# Patient Record
Sex: Female | Born: 1970 | Race: White | Hispanic: No | Marital: Married | Smoking: Never smoker
Health system: Southern US, Community
[De-identification: ages and names within clinical notes are randomized; demographics above are authoritative.]

## PROBLEM LIST (undated history)

## (undated) DIAGNOSIS — E109 Type 1 diabetes mellitus without complications: Secondary | ICD-10-CM

## (undated) DIAGNOSIS — E039 Hypothyroidism, unspecified: Secondary | ICD-10-CM

## (undated) HISTORY — DX: Type 1 diabetes mellitus without complications: E10.9

## (undated) HISTORY — DX: Hypothyroidism, unspecified: E03.9

---

## 2001-05-27 ENCOUNTER — Inpatient Hospital Stay (HOSPITAL_COMMUNITY): Admission: AD | Admit: 2001-05-27 | Discharge: 2001-05-29 | Payer: Self-pay | Admitting: *Deleted

## 2001-07-06 ENCOUNTER — Other Ambulatory Visit: Admission: RE | Admit: 2001-07-06 | Discharge: 2001-07-06 | Payer: Self-pay | Admitting: Gynecology

## 2002-01-03 ENCOUNTER — Encounter: Payer: Self-pay | Admitting: Gynecology

## 2002-01-03 ENCOUNTER — Encounter: Admission: RE | Admit: 2002-01-03 | Discharge: 2002-01-03 | Payer: Self-pay | Admitting: Gynecology

## 2003-09-19 ENCOUNTER — Other Ambulatory Visit: Admission: RE | Admit: 2003-09-19 | Discharge: 2003-09-19 | Payer: Self-pay | Admitting: Gynecology

## 2007-03-09 ENCOUNTER — Other Ambulatory Visit: Admission: RE | Admit: 2007-03-09 | Discharge: 2007-03-09 | Payer: Self-pay | Admitting: Gynecology

## 2009-10-24 ENCOUNTER — Ambulatory Visit: Payer: Self-pay | Admitting: Women's Health

## 2009-10-24 ENCOUNTER — Other Ambulatory Visit: Admission: RE | Admit: 2009-10-24 | Discharge: 2009-10-24 | Payer: Self-pay | Admitting: Gynecology

## 2010-07-11 NOTE — Discharge Summary (Signed)
[facility]  Patient:    DEVA, RON Visit Number: [id number] MRN: [id number]          Service Type: OBS Location: [id number] Attending Physician:  Douglass Rivers Dictated by:   Antony Contras, [facility] Admit Date:  05/27/2001 Discharge Date: 05/29/2001                             Discharge Summary  DISCHARGE DIAGNOSES:          1. Intrauterine pregnancy at 38-4/7 weeks.                                  Spontaneous onset of labor.                               2. History of gestational diabetes.                               3. Hypothyroidism.  PROCEDURES:                   Normal spontaneous vaginal delivery of a viable infant over intact perineum with repair of second-degree laceration.  HISTORY OF PRESENT ILLNESS:   The patient is a 40 year old gravida 2, para 1-0-0-1 with an LMP of August 29, 2000.  [facility] June 06, 2001.  Prenatal risk factors include a history of gestational diabetes, hypothyroidism well controlled, fetal pyelectasis which did resolve during the current pregnancy.  LABORATORY DATA:              Blood type A+, antibody screen negative.  RPR, HBsAg, HIV nonreactive.  GBS was negative.  HOSPITAL COURSE:              The patient was admitted on May 27, 2001 with spontaneous onset of labor.  Cervix was 3 cm, 70%, and -2 station.  She did progress to complete dilatation and delivered an Apgar 8/9 female infant weighing 8 pounds and 1 ounce over an intact perineum with repair of second-degree laceration.  POSTPARTUM COURSE:            She remained afebrile.  No difficulty voiding. She was able to be discharged in satisfactory condition on her second postpartum day.  LABORATORY DATA:              CBC:  Hematocrit 31.3, hemoglobin 10.9, WBC 10, platelets 137.  DISPOSITION:                  Follow up in six weeks.  Continue with prenatal vitamins and iron.  Motrin and Tylox for pain. Dictated by:   Antony Contras,  [facility] Attending Physician:  Douglass Rivers DD:  06/13/01 TD:  06/13/01 Job: [id number]

## 2011-04-20 ENCOUNTER — Other Ambulatory Visit: Payer: Self-pay | Admitting: Endocrinology

## 2011-04-20 DIAGNOSIS — E041 Nontoxic single thyroid nodule: Secondary | ICD-10-CM

## 2011-04-29 ENCOUNTER — Other Ambulatory Visit: Payer: Self-pay

## 2011-05-04 ENCOUNTER — Ambulatory Visit
Admission: RE | Admit: 2011-05-04 | Discharge: 2011-05-04 | Disposition: A | Discharge status: Home / Self Care | Payer: 59 | Source: Ambulatory Visit | Attending: Endocrinology | Admitting: Endocrinology

## 2011-05-04 DIAGNOSIS — E041 Nontoxic single thyroid nodule: Secondary | ICD-10-CM

## 2011-09-04 ENCOUNTER — Encounter: Payer: Self-pay | Admitting: Gynecology

## 2011-09-04 ENCOUNTER — Encounter: Payer: Self-pay | Admitting: Women's Health

## 2011-09-04 DIAGNOSIS — E039 Hypothyroidism, unspecified: Secondary | ICD-10-CM | POA: Insufficient documentation

## 2011-09-04 DIAGNOSIS — G510 Bell's palsy: Secondary | ICD-10-CM | POA: Insufficient documentation

## 2011-09-09 ENCOUNTER — Encounter: Payer: 59 | Admitting: Women's Health

## 2011-09-21 ENCOUNTER — Encounter: Payer: 59 | Admitting: Women's Health

## 2011-09-24 ENCOUNTER — Ambulatory Visit (INDEPENDENT_AMBULATORY_CARE_PROVIDER_SITE_OTHER): Payer: 59 | Admitting: Women's Health

## 2011-09-24 ENCOUNTER — Encounter: Payer: Self-pay | Admitting: Women's Health

## 2011-09-24 ENCOUNTER — Other Ambulatory Visit (HOSPITAL_COMMUNITY)
Admission: RE | Admit: 2011-09-24 | Discharge: 2011-09-24 | Disposition: A | Discharge status: Home / Self Care | Payer: 59 | Source: Ambulatory Visit | Attending: Obstetrics and Gynecology | Admitting: Obstetrics and Gynecology

## 2011-09-24 VITALS — BP 112/70 | Ht 63.5 in | Wt 118.0 lb

## 2011-09-24 DIAGNOSIS — Z01419 Encounter for gynecological examination (general) (routine) without abnormal findings: Secondary | ICD-10-CM

## 2011-09-24 DIAGNOSIS — Z8632 Personal history of gestational diabetes: Secondary | ICD-10-CM | POA: Insufficient documentation

## 2011-09-24 NOTE — Patient Instructions (Addendum)

## 2011-09-24 NOTE — Progress Notes (Signed)
Eileen Gomez 1970/12/23 [id number]    History:    The patient presents for annual exam.  Monthly 4-5 day cycles/vasectomy. History of GDM and hypothyroidism.  History of normal Paps, last one 2011. Has not had a baseline mammogram.   Past medical history, past surgical history, family history and social history were all reviewed and documented in the EPIC chart. Works several hours a day at [employer]. Daughter Kara Mead 15,  Elizabeth 10, both doing well. History of bells palsy in 2003 resolved. Contemplating a breast lift and tummy tuck due to loose skin.   ROS:  A  ROS was performed and pertinent positives and negatives are included in the history.  Exam:  Filed Vitals:   09/24/11 1439  BP: 112/70    General appearance:  Normal Head/Neck:  Normal, without cervical or supraclavicular adenopathy. Thyroid:  Symmetrical, normal in size, without palpable masses or nodularity. Respiratory  Effort:  Normal  Auscultation:  Clear without wheezing or rhonchi Cardiovascular  Auscultation:  Regular rate, without rubs, murmurs or gallops  Edema/varicosities:  Not grossly evident Abdominal  Soft,nontender, without masses, guarding or rebound.  Liver/spleen:  No organomegaly noted  Hernia:  None appreciated  Skin  Inspection:  Grossly normal  Palpation:  Grossly normal Neurologic/psychiatric  Orientation:  Normal with appropriate conversation.  Mood/affect:  Normal  Genitourinary    Breasts: Examined lying and sitting.     Right: Without masses, retractions, discharge or axillary adenopathy.     Left: Without masses, retractions, discharge or axillary adenopathy.   Inguinal/mons:  Normal without inguinal adenopathy  External genitalia:  Normal  BUS/Urethra/Skene's glands:  Normal  Bladder:  Normal  Vagina:  Normal  Cervix:  Normal  Uterus:   normal in size, shape and contour.  Midline and mobile  Adnexa/parametria:     Rt: Without masses or tenderness.   Lt: Without masses  or tenderness.  Anus and perineum: Normal  Digital rectal exam: Normal sphincter tone without palpated masses or tenderness  Assessment/Plan:  41 y.o. M. WF G2 P2 for annual exam without complaint.  Normal GYN exam Hypothyroid-Synthroid 50 mcg labs and meds at primary care.  Plan: SBE's, schedule annual mammogram, calcium rich diet, vitamin D 1000 daily, continue exercise/daily running. Pap only, reviewed new screening guidelines.    Harrington Challenger [facility], 4:01 PM 09/24/2011

## 2012-09-25 ENCOUNTER — Other Ambulatory Visit: Payer: Self-pay | Admitting: Endocrinology

## 2012-09-25 DIAGNOSIS — E039 Hypothyroidism, unspecified: Secondary | ICD-10-CM

## 2012-09-26 ENCOUNTER — Other Ambulatory Visit (INDEPENDENT_AMBULATORY_CARE_PROVIDER_SITE_OTHER): Payer: 59

## 2012-09-26 DIAGNOSIS — E039 Hypothyroidism, unspecified: Secondary | ICD-10-CM

## 2012-09-26 LAB — TSH: TSH: 1.82 u[IU]/mL (ref 0.35–5.50)

## 2012-09-28 ENCOUNTER — Ambulatory Visit: Payer: 59 | Admitting: Endocrinology

## 2012-09-30 ENCOUNTER — Encounter: Payer: Self-pay | Admitting: Endocrinology

## 2012-09-30 ENCOUNTER — Ambulatory Visit (INDEPENDENT_AMBULATORY_CARE_PROVIDER_SITE_OTHER): Payer: 59 | Admitting: Endocrinology

## 2012-09-30 VITALS — BP 110/74 | HR 63 | Temp 98.6°F | Resp 10 | Ht 64.0 in | Wt 116.5 lb

## 2012-09-30 DIAGNOSIS — Z131 Encounter for screening for diabetes mellitus: Secondary | ICD-10-CM

## 2012-09-30 DIAGNOSIS — E039 Hypothyroidism, unspecified: Secondary | ICD-10-CM

## 2012-09-30 NOTE — Progress Notes (Signed)
Patient ID: Eileen Gomez, female   DOB: 10-21-1970, 42 y.o.   MRN: [id number]  Reason for Appointment:  Hypothyroidism, followup visit    History of Present Illness:   The hypothyroidism was first diagnosed at age 89  Complaints are reported by the patient now are none, no unusual fatigue, dry skin or hair loss              The treatments that the patient has taken include Synthroid and has been on a stable dose of 50 mcg for several years.           She also has had a history of goiter likely to be from Hashimoto's thyroiditis        Compliance with the medical regimen has been as prescribed with taking the tablet in the morning before breakfast.  Appointment on 09/26/2012  Component Date Value Range Status  . TSH 09/26/2012 1.82  0.35 - 5.50 uIU/mL Final  . Free T4 09/26/2012 1.01  0.60 - 1.60 ng/dL Final      Medication List       This list is accurate as of: 09/30/12  3:25 PM.  Always use your most recent med list.               levothyroxine 50 MCG tablet  Commonly known as:  SYNTHROID, LEVOTHROID  Take 50 mcg by mouth daily.        Past Medical History  Diagnosis Date  . Diabetes mellitus     gestational diabetes--diet  . Hypothyroid   . Bell's palsy 2003    resolved    No past surgical history on file.  Family History  Problem Relation Age of Onset  . Hypertension Mother   . Heart disease Mother   . Hypertension Maternal Grandmother   . Heart disease Maternal Grandmother   . Breast cancer Paternal Grandmother     Social History:  reports that she has never smoked. She has never used smokeless tobacco. She reports that she does not drink alcohol or use illicit drugs.  Allergies: No Known Allergies   Examination:   BP 110/74  Pulse 63  Temp(Src) 98.6 F (37 C)  Resp 10  Ht 5\' 4"  (1.626 m)  Wt 116 lb 8 oz (52.844 kg)  BMI 19.99 kg/m2  SpO2 99%  LMP 08/30/2012   GENERAL APPEARANCE: Alert And looks well.    FACE: No puffiness of face or  fingers      NECK: right lobe of the thyroid 2-1/2 times normal, smoothed          NEUROLOGIC EXAM: DTRs 2+ bilaterally at biceps.    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 Avoid taking any calcium or iron supplements with the thyroid supplement. To followup annually  [facility] 09/30/2012, 3:25 PM

## 2012-09-30 NOTE — Patient Instructions (Addendum)
No change 

## 2013-05-19 ENCOUNTER — Other Ambulatory Visit: Payer: Self-pay | Admitting: *Deleted

## 2013-05-19 ENCOUNTER — Telehealth: Payer: Self-pay | Admitting: Endocrinology

## 2013-05-19 MED ORDER — LEVOTHYROXINE SODIUM 50 MCG PO TABS: 50.0000 ug | ORAL_TABLET | Freq: Every day | ORAL | Status: DC

## 2013-05-19 NOTE — Telephone Encounter (Signed)
Pt states her insurance company is giving her an issue with her RX  Synthroid 50mcg  Pt wants to know if we can give her a week supply of sample  [facility] [phone]  Thank You :)

## 2013-06-01 ENCOUNTER — Telehealth: Payer: Self-pay | Admitting: Endocrinology

## 2013-06-01 ENCOUNTER — Other Ambulatory Visit: Payer: Self-pay | Admitting: *Deleted

## 2013-06-01 MED ORDER — LEVOTHYROXINE SODIUM 50 MCG PO TABS: 50.0000 ug | ORAL_TABLET | Freq: Every day | ORAL | Status: DC

## 2013-06-01 NOTE — Telephone Encounter (Signed)
I spoke with the patient, rx has been sent to her local pharmacy and to her mail order pharmacy

## 2013-06-01 NOTE — Telephone Encounter (Signed)
Pt states her insurance is still giving her issues with her Rx Synthroid 50 mcg  Pt is confused and needs to speak with nurse   Call back: [phone]  Thank You :)

## 2013-11-29 ENCOUNTER — Encounter: Payer: Self-pay | Admitting: Women's Health

## 2013-12-25 ENCOUNTER — Encounter: Payer: Self-pay | Admitting: Endocrinology

## 2014-09-20 ENCOUNTER — Encounter: Payer: Self-pay | Admitting: Women's Health

## 2014-09-27 ENCOUNTER — Other Ambulatory Visit (HOSPITAL_COMMUNITY)
Admission: RE | Admit: 2014-09-27 | Discharge: 2014-09-27 | Disposition: A | Discharge status: Home / Self Care | Payer: 59 | Source: Ambulatory Visit | Attending: Gynecology | Admitting: Gynecology

## 2014-09-27 ENCOUNTER — Encounter: Payer: Self-pay | Admitting: Women's Health

## 2014-09-27 ENCOUNTER — Ambulatory Visit (INDEPENDENT_AMBULATORY_CARE_PROVIDER_SITE_OTHER): Payer: 59 | Admitting: Women's Health

## 2014-09-27 VITALS — BP 110/70 | Ht 64.0 in | Wt 119.0 lb

## 2014-09-27 DIAGNOSIS — Z01419 Encounter for gynecological examination (general) (routine) without abnormal findings: Secondary | ICD-10-CM | POA: Diagnosis not present

## 2014-09-27 NOTE — Addendum Note (Signed)
Addended by: Berna Spare A on: 09/27/2014 03:14 PM   Modules accepted: Orders, SmartSet

## 2014-09-27 NOTE — Progress Notes (Signed)
Eileen Gomez 1970/06/16 [id number]    History:    Presents for annual exam.  Monthly cycle 4-5 day cycles/vasectomy. History of normal Paps. Has not had a screening mammogram. History of GDM, bells palsy 2003. Endocrinologist manages labs hypothyroid.  Past medical history, past surgical history, family history and social history were all reviewed and documented in the EPIC chart. Works in [employer] at [employer]. [employer]. Kara Mead 17 attending [employer], [place] 12 both doing well.  ROS:  A ROS was performed and pertinent positives and negatives are included.  Exam:  Filed Vitals:   09/27/14 1153  BP: 110/70    General appearance:  Normal Thyroid:  Symmetrical, normal in size, without palpable masses or nodularity. Respiratory  Auscultation:  Clear without wheezing or rhonchi Cardiovascular  Auscultation:  Regular rate, without rubs, murmurs or gallops  Edema/varicosities:  Not grossly evident Abdominal  Soft,nontender, without masses, guarding or rebound.  Liver/spleen:  No organomegaly noted  Hernia:  None appreciated  Skin  Inspection:  Grossly normal   Breasts: Examined lying and sitting.     Right: Without masses, retractions, discharge or axillary adenopathy.     Left: Without masses, retractions, discharge or axillary adenopathy. Gentitourinary   Inguinal/mons:  Normal without inguinal adenopathy  External genitalia:  Normal  BUS/Urethra/Skene's glands:  Normal  Vagina:  Normal  Cervix:  Normal  Uterus:  normal in size, shape and contour.  Midline and mobile  Adnexa/parametria:     Rt: Without masses or tenderness.   Lt: Without masses or tenderness.  Anus and perineum: Normal  Digital rectal exam: Normal sphincter tone without palpated masses or tenderness  Assessment/Plan:  44 y.o. M WF G2 P2  for annual exam with complaint of hair loss/thinning.  Monthly 4-5 day cycles/vasectomy Hypothyroid-endocrinologist manages labs and meds  Plan: Encouraged  vitamins specifically for hair and skin may help. SBE's, reviewed importance of screening annual mammogram, breast center information given encouraged to schedule ASAP. Exercise, calcium rich diet, vitamin D 1000 daily encouraged. UA, Pap with HR HPV typing, new screening guidelines reviewed.  Mikiah Demond J WHNP, 1:25 PM 09/27/2014

## 2014-09-27 NOTE — Patient Instructions (Signed)

## 2014-10-01 LAB — CYTOLOGY - PAP

## 2016-04-02 ENCOUNTER — Encounter: Payer: Self-pay | Admitting: Women's Health

## 2016-04-02 ENCOUNTER — Ambulatory Visit (INDEPENDENT_AMBULATORY_CARE_PROVIDER_SITE_OTHER): Payer: BLUE CROSS/BLUE SHIELD | Admitting: Women's Health

## 2016-04-02 VITALS — BP 112/70

## 2016-04-02 DIAGNOSIS — N898 Other specified noninflammatory disorders of vagina: Secondary | ICD-10-CM | POA: Diagnosis not present

## 2016-04-02 DIAGNOSIS — B373 Candidiasis of vulva and vagina: Secondary | ICD-10-CM

## 2016-04-02 DIAGNOSIS — L739 Follicular disorder, unspecified: Secondary | ICD-10-CM

## 2016-04-02 DIAGNOSIS — B3731 Acute candidiasis of vulva and vagina: Secondary | ICD-10-CM

## 2016-04-02 LAB — WET PREP FOR TRICH, YEAST, CLUE
CLUE CELLS WET PREP: NONE SEEN
TRICH WET PREP: NONE SEEN
Yeast Wet Prep HPF POC: NONE SEEN

## 2016-04-02 MED ORDER — CEPHALEXIN 500 MG PO CAPS: 500.0000 mg | ORAL_CAPSULE | Freq: Four times a day (QID) | ORAL | 0 refills | Status: DC

## 2016-04-02 MED ORDER — FLUCONAZOLE 150 MG PO TABS: 150.0000 mg | ORAL_TABLET | Freq: Once | ORAL | 1 refills | Status: AC

## 2016-04-02 NOTE — Patient Instructions (Signed)

## 2016-04-02 NOTE — Progress Notes (Signed)
Presents with a complain vaginal itching and leision on left upper side of her clitoris.  The itch started a week ago while the lesion erupted 4 days ago. She noted some blood tinged discharge from the lesion. Denies any history of genital herpes, in a monogamous relationship with her husband. Denies fever, chills, abdominal pain, nausea and vomiting. Denies spotting or abnormal vaginal bleeding.History of normal PAP.Monthly cycle 4-5 day cycles/vasectomy. Patient is a runner and has increased her frequency recently.  Current Outpatient Prescriptions on File Prior to Visit  Medication Sig Dispense Refill  . levothyroxine (SYNTHROID, LEVOTHROID) 50 MCG tablet Take 1 tablet (50 mcg total) by mouth daily. 30 tablet 1   No current facility-administered medications on file prior to visit.    No Known Allergies   ROS:  A ROS was performed and pertinent positives and negatives are included.  Exam: Pea size Superficial cyst located on the upper left side of Labia majora, tip erythmatous and firm, tender to touch, non-indurated, no discharge noted. Other external genitalia structures normal and intact.  Wet prep: positive for moderate WBC, many bacteria, >20 hpf epithelia cells.  Assessment Perineal Folliculitis Yeast vaginitis  PLAN  1. Perineal folliculitis  - cephALEXin (KEFLEX) 500 MG capsule; Take 1 capsule (500 mg total) by mouth 4 (four) times daily.  Dispense: 20 capsule; Refill: 0  2. Yeast vaginitis - WET PREP FOR TRICH, YEAST, CLUE - fluconazole (DIFLUCAN) 150 MG tablet; Take 1 tablet (150 mg total) by mouth once.  Dispense: 1 tablet; Refill: 1  Complete antibiotic course, avoid tong under wear, do not wear under wear to sleep at night. Once area opens apply topical antibiotic cream, keep open, dry and loose clothing.  Call if symptom worsens or does not get better.

## 2016-04-16 ENCOUNTER — Ambulatory Visit (INDEPENDENT_AMBULATORY_CARE_PROVIDER_SITE_OTHER): Payer: BLUE CROSS/BLUE SHIELD | Admitting: Women's Health

## 2016-04-16 ENCOUNTER — Encounter: Payer: Self-pay | Admitting: Women's Health

## 2016-04-16 VITALS — BP 112/78 | Ht 64.0 in | Wt 114.0 lb

## 2016-04-16 DIAGNOSIS — Z01419 Encounter for gynecological examination (general) (routine) without abnormal findings: Secondary | ICD-10-CM | POA: Diagnosis not present

## 2016-04-16 NOTE — Progress Notes (Signed)
Barkley BrunsKristin E Rosetti 10/21/1970 [id number]    History:    Presents for annual exam. Regular monthly 4-5 day cycles/vasectomy. Normal Pap history. Has not had a screening mammogram. History of GDM diet. Hypothyroid endocrinologist manages reports normal blood sugars at endocrinologist.  Past medical history, past surgical history, family history and social history were all reviewed and documented in the EPIC chart. Works [employer]. [employer] also in the  reserves. Lanora Manislizabeth 14 doing well both had received gardasil. Mother hypertension and heart disease.  ROS:  A ROS was performed and pertinent positives and negatives are included.  Exam:  Vitals:   04/16/16 1431  BP: 112/78  Weight: 114 lb (51.7 kg)  Height: 5\' 4"  (1.626 m)   Body mass index is 19.57 kg/m.   General appearance:  Normal Thyroid:  Symmetrical, normal in size, without palpable masses or nodularity. Respiratory  Auscultation:  Clear without wheezing or rhonchi Cardiovascular  Auscultation:  Regular rate, without rubs, murmurs or gallops  Edema/varicosities:  Not grossly evident Abdominal  Soft,nontender, without masses, guarding or rebound.  Liver/spleen:  No organomegaly noted  Hernia:  None appreciated  Skin  Inspection:  Grossly normal   Breasts: Examined lying and sitting.     Right: Without masses, retractions, discharge or axillary adenopathy.     Left: Without masses, retractions, discharge or axillary adenopathy. Gentitourinary   Inguinal/mons:  Normal without inguinal adenopathy  External genitalia:  Normal  BUS/Urethra/Skene's glands:  Normal  Vagina:  Normal  Cervix:  Normal  Uterus:   normal in size, shape and contour.  Midline and mobile  Adnexa/parametria:     Rt: Without masses or tenderness.   Lt: Without masses or tenderness.  Anus and perineum: Normal  Digital rectal exam: Normal sphincter tone without palpated masses or  tenderness  Assessment/Plan:  46 y.o. MWF G2 P2  for annual exam with no complaints.  Regular monthly 4-5 day cycles/vasectomy Hypothyroid/endocrinologist manages labs and meds  In: SBE's, reviewed importance of annual screening mammogram breast center information given and reviewed instructed to schedule. Continue healthy lifestyle of regular exercise, calcium rich diet, vitamin D 1000 daily encouraged. Pap, requested minimum due to poor insurance, normal Pap history  reviewed HR HPV typing, declines.     Harrington ChallengerYOUNG,Jeidy Hoerner J [facility]amonWHNP, 3:29 PM 04/16/2016

## 2016-04-16 NOTE — Patient Instructions (Addendum)
Mammogram  [id number] Maintenance, Female Introduction Adopting a healthy lifestyle and getting preventive care can go a long way to promote health and wellness. Talk with your health care provider about what schedule of regular examinations is right for you. This is a good chance for you to check in with your provider about disease prevention and staying healthy. In between checkups, there are plenty of things you can do on your own. Experts have done a lot of research about which lifestyle changes and preventive measures are most likely to keep you healthy. Ask your health care provider for more information. Weight and diet Eat a healthy diet  Be sure to include plenty of vegetables, fruits, low-fat dairy products, and lean protein.  Do not eat a lot of foods high in solid fats, added sugars, or salt.  Get regular exercise. This is one of the most important things you can do for your health.  Most adults should exercise for at least 150 minutes each week. The exercise should increase your heart rate and make you sweat (moderate-intensity exercise).  Most adults should also do strengthening exercises at least twice a week. This is in addition to the moderate-intensity exercise. Maintain a healthy weight  Body mass index (BMI) is a measurement that can be used to identify possible weight problems. It estimates body fat based on height and weight. Your health care provider can help determine your BMI and help you achieve or maintain a healthy weight.  For females 26 years of age and older:  A BMI below 18.5 is considered underweight.  A BMI of 18.5 to 24.9 is normal.  A BMI of 25 to 29.9 is considered overweight.  A BMI of 30 and above is considered obese. Watch levels of cholesterol and blood lipids  You should start having your blood tested for lipids and cholesterol at 45 years of age, then have this test every 5 years.  You may need to have your cholesterol levels checked more  often if:  Your lipid or cholesterol levels are high.  You are older than 45 years of age.  You are at high risk for heart disease. Cancer screening Lung Cancer  Lung cancer screening is recommended for adults 33-53 years old who are at high risk for lung cancer because of a history of smoking.  A yearly low-dose CT scan of the lungs is recommended for people who:  Currently smoke.  Have quit within the past 15 years.  Have at least a 30-pack-year history of smoking. A pack year is smoking an average of one pack of cigarettes a day for 1 year.  Yearly screening should continue until it has been 15 years since you quit.  Yearly screening should stop if you develop a health problem that would prevent you from having lung cancer treatment. Breast Cancer  Practice breast self-awareness. This means understanding how your breasts normally appear and feel.  It also means doing regular breast self-exams. Let your health care provider know about any changes, no matter how small.  If you are in your 20s or 30s, you should have a clinical breast exam (CBE) by a health care provider every 1-3 years as part of a regular health exam.  If you are 27 or older, have a CBE every year. Also consider having a breast X-ray (mammogram) every year.  If you have a family history of breast cancer, talk to your health care provider about genetic screening.  If you are at high risk for  breast cancer, talk to your health care provider about having an MRI and a mammogram every year.  Breast cancer gene (BRCA) assessment is recommended for women who have family members with BRCA-related cancers. BRCA-related cancers include:  Breast.  Ovarian.  Tubal.  Peritoneal cancers.  Results of the assessment will determine the need for genetic counseling and BRCA1 and BRCA2 testing. Cervical Cancer  Your health care provider may recommend that you be screened regularly for cancer of the pelvic organs  (ovaries, uterus, and vagina). This screening involves a pelvic examination, including checking for microscopic changes to the surface of your cervix (Pap test). You may be encouraged to have this screening done every 3 years, beginning at age 67.  For women ages 7-65, health care providers may recommend pelvic exams and Pap testing every 3 years, or they may recommend the Pap and pelvic exam, combined with testing for human papilloma virus (HPV), every 5 years. Some types of HPV increase your risk of cervical cancer. Testing for HPV may also be done on women of any age with unclear Pap test results.  Other health care providers may not recommend any screening for nonpregnant women who are considered low risk for pelvic cancer and who do not have symptoms. Ask your health care provider if a screening pelvic exam is right for you.  If you have had past treatment for cervical cancer or a condition that could lead to cancer, you need Pap tests and screening for cancer for at least 20 years after your treatment. If Pap tests have been discontinued, your risk factors (such as having a new sexual partner) need to be reassessed to determine if screening should resume. Some women have medical problems that increase the chance of getting cervical cancer. In these cases, your health care provider may recommend more frequent screening and Pap tests. Colorectal Cancer  This type of cancer can be detected and often prevented.  Routine colorectal cancer screening usually begins at 45 years of age and continues through 45 years of age.  Your health care provider may recommend screening at an earlier age if you have risk factors for colon cancer.  Your health care provider may also recommend using home test kits to check for hidden blood in the stool.  A small camera at the end of a tube can be used to examine your colon directly (sigmoidoscopy or colonoscopy). This is done to check for the earliest forms of  colorectal cancer.  Routine screening usually begins at age 90.  Direct examination of the colon should be repeated every 5-10 years through 45 years of age. However, you may need to be screened more often if early forms of precancerous polyps or small growths are found. Skin Cancer  Check your skin from head to toe regularly.  Tell your health care provider about any new moles or changes in moles, especially if there is a change in a mole's shape or color.  Also tell your health care provider if you have a mole that is larger than the size of a pencil eraser.  Always use sunscreen. Apply sunscreen liberally and repeatedly throughout the day.  Protect yourself by wearing long sleeves, pants, a wide-brimmed hat, and sunglasses whenever you are outside. Heart disease, diabetes, and high blood pressure  High blood pressure causes heart disease and increases the risk of stroke. High blood pressure is more likely to develop in:  People who have blood pressure in the high end of the normal range (130-139/85-89  mm Hg).  People who are overweight or obese.  People who are African American.  If you are 72-93 years of age, have your blood pressure checked every 3-5 years. If you are 58 years of age or older, have your blood pressure checked every year. You should have your blood pressure measured twice-once when you are at a hospital or clinic, and once when you are not at a hospital or clinic. Record the average of the two measurements. To check your blood pressure when you are not at a hospital or clinic, you can use:  An automated blood pressure machine at a pharmacy.  A home blood pressure monitor.  If you are between 96 years and 21 years old, ask your health care provider if you should take aspirin to prevent strokes.  Have regular diabetes screenings. This involves taking a blood sample to check your fasting blood sugar level.  If you are at a normal weight and have a low risk for  diabetes, have this test once every three years after 45 years of age.  If you are overweight and have a high risk for diabetes, consider being tested at a younger age or more often. Preventing infection Hepatitis B  If you have a higher risk for hepatitis B, you should be screened for this virus. You are considered at high risk for hepatitis B if:  You were born in a country where hepatitis B is common. Ask your health care provider which countries are considered high risk.  Your parents were born in a high-risk country, and you have not been immunized against hepatitis B (hepatitis B vaccine).  You have HIV or AIDS.  You use needles to inject street drugs.  You live with someone who has hepatitis B.  You have had sex with someone who has hepatitis B.  You get hemodialysis treatment.  You take certain medicines for conditions, including cancer, organ transplantation, and autoimmune conditions. Hepatitis C  Blood testing is recommended for:  Everyone born from [birth year] through 1965.  Anyone with known risk factors for hepatitis C. Sexually transmitted infections (STIs)  You should be screened for sexually transmitted infections (STIs) including gonorrhea and chlamydia if:  You are sexually active and are younger than 45 years of age.  You are older than 45 years of age and your health care provider tells you that you are at risk for this type of infection.  Your sexual activity has changed since you were last screened and you are at an increased risk for chlamydia or gonorrhea. Ask your health care provider if you are at risk.  If you do not have HIV, but are at risk, it may be recommended that you take a prescription medicine daily to prevent HIV infection. This is called pre-exposure prophylaxis (PrEP). You are considered at risk if:  You are sexually active and do not regularly use condoms or know the HIV status of your partner(s).  You take drugs by injection.  You are  sexually active with a partner who has HIV. Talk with your health care provider about whether you are at high risk of being infected with HIV. If you choose to begin PrEP, you should first be tested for HIV. You should then be tested every 3 months for as long as you are taking PrEP. Pregnancy  If you are premenopausal and you may become pregnant, ask your health care provider about preconception counseling.  If you may become pregnant, take 400 to 800 micrograms (mcg)  of folic acid every day.  If you want to prevent pregnancy, talk to your health care provider about birth control (contraception). Osteoporosis and menopause  Osteoporosis is a disease in which the bones lose minerals and strength with aging. This can result in serious bone fractures. Your risk for osteoporosis can be identified using a bone density scan.  If you are 30 years of age or older, or if you are at risk for osteoporosis and fractures, ask your health care provider if you should be screened.  Ask your health care provider whether you should take a calcium or vitamin D supplement to lower your risk for osteoporosis.  Menopause may have certain physical symptoms and risks.  Hormone replacement therapy may reduce some of these symptoms and risks. Talk to your health care provider about whether hormone replacement therapy is right for you. Follow these instructions at home:  Schedule regular health, dental, and eye exams.  Stay current with your immunizations.  Do not use any tobacco products including cigarettes, chewing tobacco, or electronic cigarettes.  If you are pregnant, do not drink alcohol.  If you are breastfeeding, limit how much and how often you drink alcohol.  Limit alcohol intake to no more than 1 drink per day for nonpregnant women. One drink equals 12 ounces of beer, 5 ounces of wine, or 1 ounces of hard liquor.  Do not use street drugs.  Do not share needles.  Ask your health care  provider for help if you need support or information about quitting drugs.  Tell your health care provider if you often feel depressed.  Tell your health care provider if you have ever been abused or do not feel safe at home. This information is not intended to replace advice given to you by your health care provider. Make sure you discuss any questions you have with your health care provider. Document Released: 08/25/2010 Document Revised: 07/18/2015 Document Reviewed: 11/13/2014  2017 [employer]

## 2016-04-17 LAB — PAP IG W/ RFLX HPV ASCU

## 2016-06-16 ENCOUNTER — Ambulatory Visit (INDEPENDENT_AMBULATORY_CARE_PROVIDER_SITE_OTHER): Payer: BLUE CROSS/BLUE SHIELD | Admitting: Women's Health

## 2016-06-16 ENCOUNTER — Encounter: Payer: Self-pay | Admitting: Women's Health

## 2016-06-16 VITALS — BP 112/70 | Ht 64.0 in | Wt 116.0 lb

## 2016-06-16 DIAGNOSIS — L739 Follicular disorder, unspecified: Secondary | ICD-10-CM

## 2016-06-16 NOTE — Progress Notes (Signed)
Presents with complaint of small painful bump on the left inner labia minora that has resolved. States thought she could not cancel without any penalty. States this area has been off and on for the past few weeks that started after a small cut with shaving. Denies vaginal discharge, urinary symptoms, abdominal pain or fever.  Exam: Appears well. External genitalia within normal limits, no visible erythema, lesion palpable nodule.  Resolved probable folliculitis  Plan: Encouraged loose clothing, change soon after running, loose clothing with running.  Warm soaks, Neosporin to affected area or return to office as needed.

## 2016-12-18 ENCOUNTER — Ambulatory Visit
Admission: RE | Admit: 2016-12-18 | Discharge: 2016-12-18 | Disposition: A | Discharge status: Home / Self Care | Payer: BLUE CROSS/BLUE SHIELD | Source: Ambulatory Visit | Attending: Family Medicine | Admitting: Family Medicine

## 2016-12-18 ENCOUNTER — Other Ambulatory Visit: Payer: Self-pay | Admitting: Family Medicine

## 2016-12-18 DIAGNOSIS — R109 Unspecified abdominal pain: Secondary | ICD-10-CM

## 2017-01-27 ENCOUNTER — Ambulatory Visit: Payer: BLUE CROSS/BLUE SHIELD | Admitting: *Deleted

## 2017-02-09 ENCOUNTER — Encounter: Payer: Self-pay | Admitting: Registered"

## 2017-02-09 ENCOUNTER — Encounter: Payer: BLUE CROSS/BLUE SHIELD | Attending: Family Medicine | Admitting: Registered"

## 2017-02-09 DIAGNOSIS — Z713 Dietary counseling and surveillance: Secondary | ICD-10-CM | POA: Insufficient documentation

## 2017-02-09 DIAGNOSIS — E119 Type 2 diabetes mellitus without complications: Secondary | ICD-10-CM | POA: Diagnosis not present

## 2017-02-09 NOTE — Progress Notes (Signed)
Diabetes Self-Management Education  Visit Type: First/Initial  Appt. Start Time: 1415 Appt. End Time: 1530  02/09/2017  Ms. Eileen Gomez, identified by name and date of birth, is a 46 y.o. female with a diagnosis of Diabetes: Type 2.   ASSESSMENT Patient states she was instructed to only check BG in the morning. Patient states it is usually in the 80's. Pt states since October she has had 2 nights waking up sweating and BG was in 60's. Patient states if she eats more than 30 g carbs her BG will go over 200 mg/dL.   Pt states she had GDM with both pregnancies and at each annual exam after was told that her A1c is fine until this last visit when is came back 14%. Patient states after researching her symptoms low tolerance of carbs, she believes she may have a form of DM that is going into the T1DM realm. Pt states her doctor told her she is still making insulin.  Per diet history patient is likely not getting enough nutrition or calories. Patient states she is not hungry most of the time and is afraid to eat many foods due to what it might do to her blood sugar. Per chart patient BMI is 20.   Patient states she runs 60 min in the morning and walks most evenings 30-60 min after dinner to help with BG control.   Patient reports 7-8 hours sleep, but is a light sleeper and may not be restful sleep.  Because patient was interested in being followed by an endocrinologist, after the visit RD mailed a list of endocrinologists in the area to the patient and suggested getting 2 tests:  anti-GAD as well as a c-peptide test.  Diabetes Self-Management Education - 02/09/17 1420      Visit Information   Visit Type  First/Initial      Initial Visit   Diabetes Type  Type 2    Are you currently following a meal plan?  Yes    What type of meal plan do you follow?  low carb since 2003 after GDM    Are you taking your medications as prescribed?  Yes    Date Diagnosed  Dec 15, 2016      Health Coping   How would you rate your overall health?  Good      Psychosocial Assessment   Patient Belief/Attitude about Diabetes  Motivated to manage diabetes    Other persons present  Family Member    Patient Concerns  Glycemic Control    How often do you need to have someone help you when you read instructions, pamphlets, or other written materials from your doctor or pharmacy?  1 - Never    What is the last grade level you completed in school?  1st year grad school      Complications   Last HgB A1C per patient/outside source  14 %    How often do you check your blood sugar?  1-2 times/day    Fasting Blood glucose range (mg/dL)  [phone]    Number of hypoglycemic episodes per month  1    Can you tell when your blood sugar is low?  Yes    What do you do if your blood sugar is low?  honey roasted almonds, water went back to bed    Number of hyperglycemic episodes per week  1    Can you tell when your blood sugar is high?  No    Have you had  a dilated eye exam in the past 12 months?  Yes    Have you had a dental exam in the past 12 months?  Yes    Are you checking your feet?  Yes    How many days per week are you checking your feet?  7      Dietary Intake   Breakfast  1 oz walnuts, 1 oz mixed nuts, water, decaf coffee with 1/2 & 1/2    Snack (morning)  none    Lunch  inside of sandwich OR yogurt    Snack (afternoon)  none    Dinner  meat, 2 vegetables    Snack (evening)  none OR cheddar popcorn and pistachios    Beverage(s)  2 coke zero, water, decaff coffee 2 splenda, half n half      Exercise   Exercise Type  Strenuous (running)    How many days per week to you exercise?  7    How many minutes per day do you exercise?  90    Total minutes per week of exercise  630      Patient Education   Previous Diabetes Education  Yes (please comment)    Disease state   Definition of diabetes, type 1 and 2, and the diagnosis of diabetes    Nutrition management   Role of diet in the treatment of diabetes  and the relationship between the three main macronutrients and blood glucose level;Carbohydrate counting;Food label reading, portion sizes and measuring food.    Monitoring  Identified appropriate SMBG and/or A1C goals.    Acute complications  Taught treatment of hypoglycemia - the 15 rule.    Psychosocial adjustment  Role of stress on diabetes      Individualized Goals (developed by patient)   Nutrition  General guidelines for healthy choices and portions discussed      Outcomes   Expected Outcomes  Demonstrated interest in learning. Expect positive outcomes    Future DMSE  PRN    Program Status  Completed     Individualized Plan for Diabetes Self-Management Training:   Learning Objective:  Patient will have a greater understanding of diabetes self-management. Patient education plan is to attend individual and/or group sessions per assessed needs and concerns.   Patient Instructions  Plan:  Aim for 2-3 Carb Choices per meal (30-45 grams) +/- 1 either way  Aim for 0-1 Carbs per snack if hungry  Include protein in moderation with your meals and snacks Look for more ways to incorporate more protein during the day Consider reading food labels for Total Carbohydrate and Sat Fat Grams of foods Consider decreasing your activity in the evening Continue checking blood sugar at alternate times per day as directed by MD  Continue taking medication as directed by MD  Expected Outcomes:  Demonstrated interest in learning. Expect positive outcomes  Education material provided: Living Well with Diabetes, A1C conversion sheet and Carbohydrate counting sheet, DM medication list, sleep hygiene  If problems or questions, patient to contact team via:  Phone  Future DSME appointment: PRN

## 2017-02-09 NOTE — Patient Instructions (Signed)
Plan:  Aim for 2-3 Carb Choices per meal (30-45 grams) +/- 1 either way  Aim for 0-1 Carbs per snack if hungry  Include protein in moderation with your meals and snacks Look for more ways to incorporate more protein during the day Consider reading food labels for Total Carbohydrate and Sat Fat Grams of foods Consider decreasing your activity in the evening Continue checking blood sugar at alternate times per day as directed by MD  Continue taking medication as directed by MD

## 2019-04-22 ENCOUNTER — Ambulatory Visit: Payer: BC Managed Care – PPO | Attending: Internal Medicine

## 2019-04-22 DIAGNOSIS — Z23 Encounter for immunization: Secondary | ICD-10-CM | POA: Insufficient documentation

## 2019-04-22 NOTE — Progress Notes (Signed)
   Covid-[facility] Vaccination [facility]  Name:  Eileen Gomez    MRN: [id number] DOB: [birth year]  04/22/2019  Eileen Gomez was observed post Covid-19 immunization for 15 minutes without incidence. She was provided with Vaccine Information Sheet and instruction to access the V-Safe system.   Eileen Gomez was instructed to call 911 with any severe reactions post vaccine: Marland Kitchen Difficulty breathing  . Swelling of your face and throat  . A fast heartbeat  . A bad rash all over your body  . Dizziness and weakness    Immunizations Administered    Name Date Dose VIS Date Route   Pfizer COVID-19 Vaccine 04/22/2019  9:37 AM 0.3 mL 02/03/2019 Intramuscular   Manufacturer: [employer], [employer]   Lot: [id number]   NDC: [address]-1

## 2019-04-23 ENCOUNTER — Ambulatory Visit: Payer: BLUE CROSS/BLUE SHIELD

## 2019-05-17 ENCOUNTER — Ambulatory Visit: Payer: BC Managed Care – PPO

## 2019-05-17 ENCOUNTER — Ambulatory Visit (INDEPENDENT_AMBULATORY_CARE_PROVIDER_SITE_OTHER): Payer: BC Managed Care – PPO | Admitting: Women's Health

## 2019-05-17 ENCOUNTER — Encounter: Payer: Self-pay | Admitting: Women's Health

## 2019-05-17 ENCOUNTER — Other Ambulatory Visit: Payer: Self-pay

## 2019-05-17 VITALS — BP 122/80 | Ht 64.0 in | Wt 125.0 lb

## 2019-05-17 DIAGNOSIS — Z01419 Encounter for gynecological examination (general) (routine) without abnormal findings: Secondary | ICD-10-CM | POA: Diagnosis not present

## 2019-05-17 NOTE — Addendum Note (Signed)
Addended by: Tito Dine on: 05/17/2019 03:02 PM   Modules accepted: Orders

## 2019-05-17 NOTE — Patient Instructions (Addendum)
It was good to see you today Vitamin D 2000 IUs daily [facility] center [phone] Health Maintenance, Female Adopting a healthy lifestyle and getting preventive care are important in promoting health and wellness. Ask your health care provider about:  The right schedule for you to have regular tests and exams.  Things you can do on your own to prevent diseases and keep yourself healthy. What should I know about diet, weight, and exercise? Eat a healthy diet   Eat a diet that includes plenty of vegetables, fruits, low-fat dairy products, and lean protein.  Do not eat a lot of foods that are high in solid fats, added sugars, or sodium. Maintain a healthy weight Body mass index (BMI) is used to identify weight problems. It estimates body fat based on height and weight. Your health care provider can help determine your BMI and help you achieve or maintain a healthy weight. Get regular exercise Get regular exercise. This is one of the most important things you can do for your health. Most adults should:  Exercise for at least 150 minutes each week. The exercise should increase your heart rate and make you sweat (moderate-intensity exercise).  Do strengthening exercises at least twice a week. This is in addition to the moderate-intensity exercise.  Spend less time sitting. Even light physical activity can be beneficial. Watch cholesterol and blood lipids Have your blood tested for lipids and cholesterol at 48 years of age, then have this test every 5 years. Have your cholesterol levels checked more often if:  Your lipid or cholesterol levels are high.  You are older than 48 years of age.  You are at high risk for heart disease. What should I know about cancer screening? Depending on your health history and family history, you may need to have cancer screening at various ages. This may include screening for:  Breast cancer.  Cervical cancer.  Colorectal cancer.  Skin cancer.  Lung  cancer. What should I know about heart disease, diabetes, and high blood pressure? Blood pressure and heart disease  High blood pressure causes heart disease and increases the risk of stroke. This is more likely to develop in people who have high blood pressure readings, are of African descent, or are overweight.  Have your blood pressure checked: ? Every 3-5 years if you are 1-68 years of age. ? Every year if you are 51 years old or older. Diabetes Have regular diabetes screenings. This checks your fasting blood sugar level. Have the screening done:  Once every three years after age 72 if you are at a normal weight and have a low risk for diabetes.  More often and at a younger age if you are overweight or have a high risk for diabetes. What should I know about preventing infection? Hepatitis B If you have a higher risk for hepatitis B, you should be screened for this virus. Talk with your health care provider to find out if you are at risk for hepatitis B infection. Hepatitis C Testing is recommended for:  Everyone born from [birth year] through 1964.  Anyone with known risk factors for hepatitis C. Sexually transmitted infections (STIs)  Get screened for STIs, including gonorrhea and chlamydia, if: ? You are sexually active and are younger than 48 years of age. ? You are older than 48 years of age and your health care provider tells you that you are at risk for this type of infection. ? Your sexual activity has changed since you were last screened, and  you are at increased risk for chlamydia or gonorrhea. Ask your health care provider if you are at risk.  Ask your health care provider about whether you are at high risk for HIV. Your health care provider may recommend a prescription medicine to help prevent HIV infection. If you choose to take medicine to prevent HIV, you should first get tested for HIV. You should then be tested every 3 months for as long as you are taking the  medicine. Pregnancy  If you are about to stop having your period (premenopausal) and you may become pregnant, seek counseling before you get pregnant.  Take 400 to 800 micrograms (mcg) of folic acid every day if you become pregnant.  Ask for birth control (contraception) if you want to prevent pregnancy. Osteoporosis and menopause Osteoporosis is a disease in which the bones lose minerals and strength with aging. This can result in bone fractures. If you are 38 years old or older, or if you are at risk for osteoporosis and fractures, ask your health care provider if you should:  Be screened for bone loss.  Take a calcium or vitamin D supplement to lower your risk of fractures.  Be given hormone replacement therapy (HRT) to treat symptoms of menopause. Follow these instructions at home: Lifestyle  Do not use any products that contain nicotine or tobacco, such as cigarettes, e-cigarettes, and chewing tobacco. If you need help quitting, ask your health care provider.  Do not use street drugs.  Do not share needles.  Ask your health care provider for help if you need support or information about quitting drugs. Alcohol use  Do not drink alcohol if: ? Your health care provider tells you not to drink. ? You are pregnant, may be pregnant, or are planning to become pregnant.  If you drink alcohol: ? Limit how much you use to 0-1 drink a day. ? Limit intake if you are breastfeeding.  Be aware of how much alcohol is in your drink. In the [place] one drink equals one 12 oz bottle of beer (355 mL), one 5 oz glass of wine (148 mL), or one 1 oz glass of hard liquor (44 mL). General instructions  Schedule regular health, dental, and eye exams.  Stay current with your vaccines.  Tell your health care provider if: ? You often feel depressed. ? You have ever been abused or do not feel safe at home. Summary  Adopting a healthy lifestyle and getting preventive care are important in  promoting health and wellness.  Follow your health care provider's instructions about healthy diet, exercising, and getting tested or screened for diseases.  Follow your health care provider's instructions on monitoring your cholesterol and blood pressure. This information is not intended to replace advice given to you by your health care provider. Make sure you discuss any questions you have with your health care provider. Document Revised: 02/02/2018 Document Reviewed: 02/02/2018 Elsevier Patient Education  2020 [employer].

## 2019-05-17 NOTE — Progress Notes (Signed)
Eileen Gomez 03-02-1970 [id number]    History:    Presents for annual exam.  Last here 3 years ago.  Monthly 4 to 5-day cycle/vasectomy.  Endocrinologist manages  hypothyroidism and diabetes.  Has always been an avid exerciser/runner, slim became type II diabetic is now insulin-dependent for past year continues to exercise but has difficulty with increased blood sugars with exercise.  History of GDM.  Normal Pap history.  Has not had a screening mammogram.  Past medical history, past surgical history, family history and social history were all reviewed and documented in the EPIC chart.  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  2 daughters Kara Mead graduated [employer] nursing school commissioned Technical sales engineer in the [employer] and Pheba 18 planning to attend [place].  Numerous family members on father side of the family diabetes.  ROS:  A ROS was performed and pertinent positives and negatives are included.  Exam:  Vitals:   05/17/19 1400  BP: 122/80  Weight: 125 lb (56.7 kg)  Height: 5\' 4"  (1.626 m)   Body mass index is 21.46 kg/m.   General appearance:  Normal Thyroid:  Symmetrical, normal in size, without palpable masses or nodularity. Respiratory  Auscultation:  Clear without wheezing or rhonchi Cardiovascular  Auscultation:  Regular rate, without rubs, murmurs or gallops  Edema/varicosities:  Not grossly evident Abdominal  Soft,nontender, without masses, guarding or rebound.  Liver/spleen:  No organomegaly noted  Hernia:  None appreciated  Skin  Inspection:  Grossly normal   Breasts: Examined lying and sitting.     Right: Without masses, retractions, discharge or axillary adenopathy.     Left: Without masses, retractions, discharge or axillary adenopathy. Gentitourinary   Inguinal/mons:  Normal without inguinal adenopathy  External genitalia:  Normal  BUS/Urethra/Skene's glands:  Normal  Vagina:  Normal  Cervix:  Normal  Uterus:  normal in size, shape and contour.  Midline and  mobile  Adnexa/parametria:     Rt: Without masses or tenderness.   Lt: Without masses or tenderness.  Anus and perineum: Normal  Digital rectal exam: Normal sphincter tone without palpated masses or tenderness  Assessment/Plan:  49 y.o. MWF G2, P2 for annual exam with no GYN complaints.  Monthly cycle/vasectomy Hypothyroidism, type 1 diabetes, insulin-dependent x1 year-endocrinologist manages labs and meds  Plan: SBEs, annual screening mammogram, breast center information given instructed to schedule.  Reviewed importance of annual screen.  Continue healthy lifestyle of healthy diet and exercise.  having difficulty with blood sugars with exercise endocrinologist unable to offer suggestions, encouraged to seek help possibly on Facebook or public media for suggestions.  Pap with HR HPV typing, Pap 2018, last office visit here.    2019 [facility], 2:32 PM 05/17/2019

## 2019-05-19 LAB — PAP, TP IMAGING W/ HPV RNA, RFLX HPV TYPE 16,18/45: HPV DNA High Risk: NOT DETECTED

## 2019-05-24 ENCOUNTER — Ambulatory Visit: Payer: BC Managed Care – PPO | Attending: Internal Medicine

## 2019-05-24 DIAGNOSIS — Z23 Encounter for immunization: Secondary | ICD-10-CM

## 2019-05-24 NOTE — Progress Notes (Signed)
   Covid-19 Vaccination Clinic  Name:  Eileen Gomez    MRN: [id number] DOB: 1970/08/11  05/24/2019  [name] was observed post Covid-19 immunization for 15 minutes without incident. She was provided with Vaccine Information Sheet and instruction to access the V-Safe system.   [name] was instructed to call 911 with any severe reactions post vaccine: Marland Kitchen Difficulty breathing  . Swelling of face and throat  . A fast heartbeat  . A bad rash all over body  . Dizziness and weakness   Immunizations Administered    Name Date Dose VIS Date Route   Pfizer COVID-19 Vaccine 05/24/2019  3:52 PM 0.3 mL 02/03/2019 Intramuscular   Manufacturer: [employer], [employer]   Lot: [id number]   NDC: [address]-6

## 2019-06-19 ENCOUNTER — Other Ambulatory Visit: Payer: Self-pay | Admitting: Family Medicine

## 2019-06-19 DIAGNOSIS — Z1231 Encounter for screening mammogram for malignant neoplasm of breast: Secondary | ICD-10-CM

## 2019-07-21 ENCOUNTER — Ambulatory Visit
Admission: RE | Admit: 2019-07-21 | Discharge: 2019-07-21 | Disposition: A | Discharge status: Home / Self Care | Payer: BC Managed Care – PPO | Source: Ambulatory Visit | Attending: Family Medicine | Admitting: Family Medicine

## 2019-07-21 ENCOUNTER — Other Ambulatory Visit: Payer: Self-pay

## 2019-07-21 DIAGNOSIS — Z1231 Encounter for screening mammogram for malignant neoplasm of breast: Secondary | ICD-10-CM

## 2020-07-18 ENCOUNTER — Encounter: Payer: Self-pay | Admitting: Obstetrics & Gynecology

## 2020-07-18 ENCOUNTER — Ambulatory Visit (INDEPENDENT_AMBULATORY_CARE_PROVIDER_SITE_OTHER): Payer: BC Managed Care – PPO | Admitting: Obstetrics & Gynecology

## 2020-07-18 ENCOUNTER — Other Ambulatory Visit: Payer: Self-pay

## 2020-07-18 VITALS — BP 110/80 | Ht 64.0 in | Wt 125.0 lb

## 2020-07-18 DIAGNOSIS — Z01419 Encounter for gynecological examination (general) (routine) without abnormal findings: Secondary | ICD-10-CM | POA: Diagnosis not present

## 2020-07-18 DIAGNOSIS — Z9189 Other specified personal risk factors, not elsewhere classified: Secondary | ICD-10-CM | POA: Diagnosis not present

## 2020-07-18 DIAGNOSIS — E119 Type 2 diabetes mellitus without complications: Secondary | ICD-10-CM

## 2020-07-18 DIAGNOSIS — Z794 Long term (current) use of insulin: Secondary | ICD-10-CM

## 2020-07-18 NOTE — Progress Notes (Signed)
CARON ODE August 17, 1970 [id number]   History:    50 y.o. G2P2L2 Married.  Vasectomy.    RP:  Established patient presenting for annual gyn exam   HPI: Menses every 3 weeks, normal flow.  No BTB.  No pelvic pain.  No pain with IC.  Breasts normal.  Endocrinologist manages  hypothyroidism and diabetes.  Has always been an avid exerciser/runner, slim, became type II diabetic and is now insulin-dependent for past 2 years, continues to exercise but has difficulty with increased blood sugars with exercise.  History of GDM. Normal Pap history.  BMI 21.46.  Past medical history,surgical history, family history and social history were all reviewed and documented in the EPIC chart.  Gynecologic History Patient's last menstrual period was 06/18/2020.  Obstetric History OB History  Gravida Para Term Preterm AB Living  2 2 2    0 2  SAB IAB Ectopic Multiple Live Births          2    # Outcome Date GA Lbr Len/2nd Weight Sex Delivery Anes PTL Lv  2 Term     F Vag-Spont  N LIV  1 Term     F Vag-Spont  N LIV     ROS: A ROS was performed and pertinent positives and negatives are included in the history.  GENERAL: No fevers or chills. HEENT: No change in vision, no earache, sore throat or sinus congestion. NECK: No pain or stiffness. CARDIOVASCULAR: No chest pain or pressure. No palpitations. PULMONARY: No shortness of breath, cough or wheeze. GASTROINTESTINAL: No abdominal pain, nausea, vomiting or diarrhea, melena or bright red blood per rectum. GENITOURINARY: No urinary frequency, urgency, hesitancy or dysuria. MUSCULOSKELETAL: No joint or muscle pain, no back pain, no recent trauma. DERMATOLOGIC: No rash, no itching, no lesions. ENDOCRINE: No polyuria, polydipsia, no heat or cold intolerance. No recent change in weight. HEMATOLOGICAL: No anemia or easy bruising or bleeding. NEUROLOGIC: No headache, seizures, numbness, tingling or weakness. PSYCHIATRIC: No depression, no loss of interest in normal  activity or change in sleep pattern.     Exam:   BP 110/80 (BP Location: Right Arm, Patient Position: Sitting, Cuff Size: Normal)   Ht 5\' 4"  (1.626 m)   Wt 125 lb (56.7 kg)   LMP 06/18/2020   BMI 21.46 kg/m   Body mass index is 21.46 kg/m.  General appearance : Well developed well nourished female. No acute distress HEENT: Eyes: no retinal hemorrhage or exudates,  Neck supple, trachea midline, no carotid bruits, no thyroidmegaly Lungs: Clear to auscultation, no rhonchi or wheezes, or rib retractions  Heart: Regular rate and rhythm, no murmurs or gallops Breast:Examined in sitting and supine position were symmetrical in appearance, no palpable masses or tenderness,  no skin retraction, no nipple inversion, no nipple discharge, no skin discoloration, no axillary or supraclavicular lymphadenopathy Abdomen: no palpable masses or tenderness, no rebound or guarding Extremities: no edema or skin discoloration or tenderness  Pelvic: Vulva: Normal             Vagina: No gross lesions or discharge  Cervix: No gross lesions or discharge  Uterus  AV, normal size, shape and consistency, non-tender and mobile  Adnexa  Without masses or tenderness  Anus: Normal   Assessment/Plan:  50 y.o. female for annual exam   1. Well female exam with routine gynecological exam Normal gynecologic exam.  Pap test March 2021 was negative, will repeat at 3 years.  Breast exam normal.  Screening mammogram May 2021  was negative.  Followed by endocrinology for type 2 diabetes mellitus on insulin and hypothyroidism on Synthroid.  Good body mass index at 21.46.  Patient is very fit, and enjoys running.  2. Relies on partner vasectomy for contraception  3. Type 2 diabetes mellitus without complication, with long-term current use of insulin (HCC)  Other orders - HUMALOG KWIKPEN 100 UNIT/ML KwikPen; SMARTSIG:5-20 SUB-Q 3 Times Daily  Genia Del MD, 3:49 PM 07/18/2020

## 2021-09-29 IMAGING — MG DIGITAL SCREENING BILAT W/ CAD
4 series · 4 of 4 positions shown · non-contrast
Comparison: None.

CLINICAL DATA: Screening.

EXAM:
DIGITAL SCREENING BILATERAL MAMMOGRAM WITH CAD

[R CC]
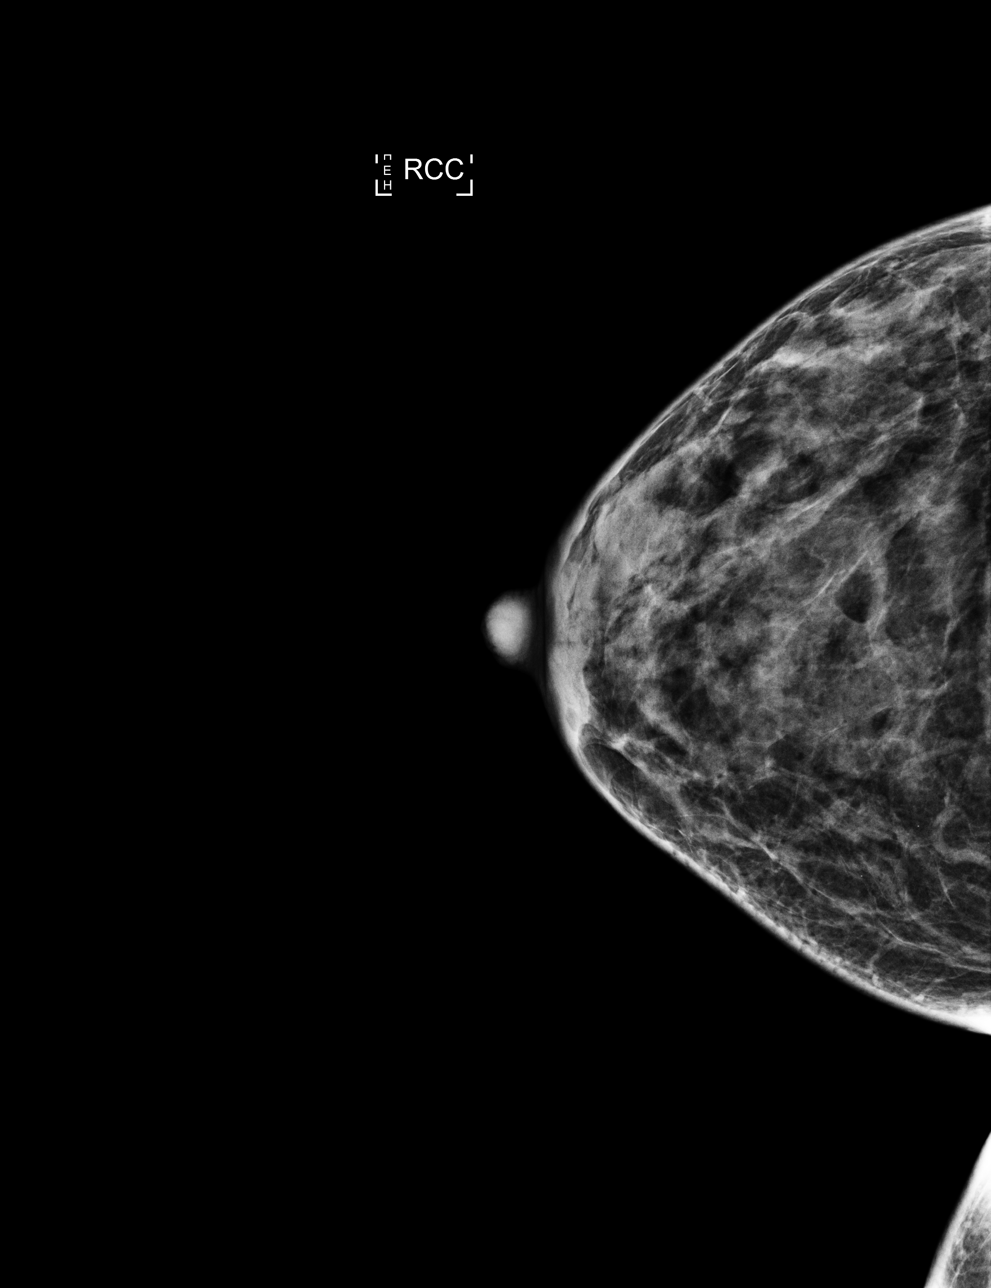

[L MLO]
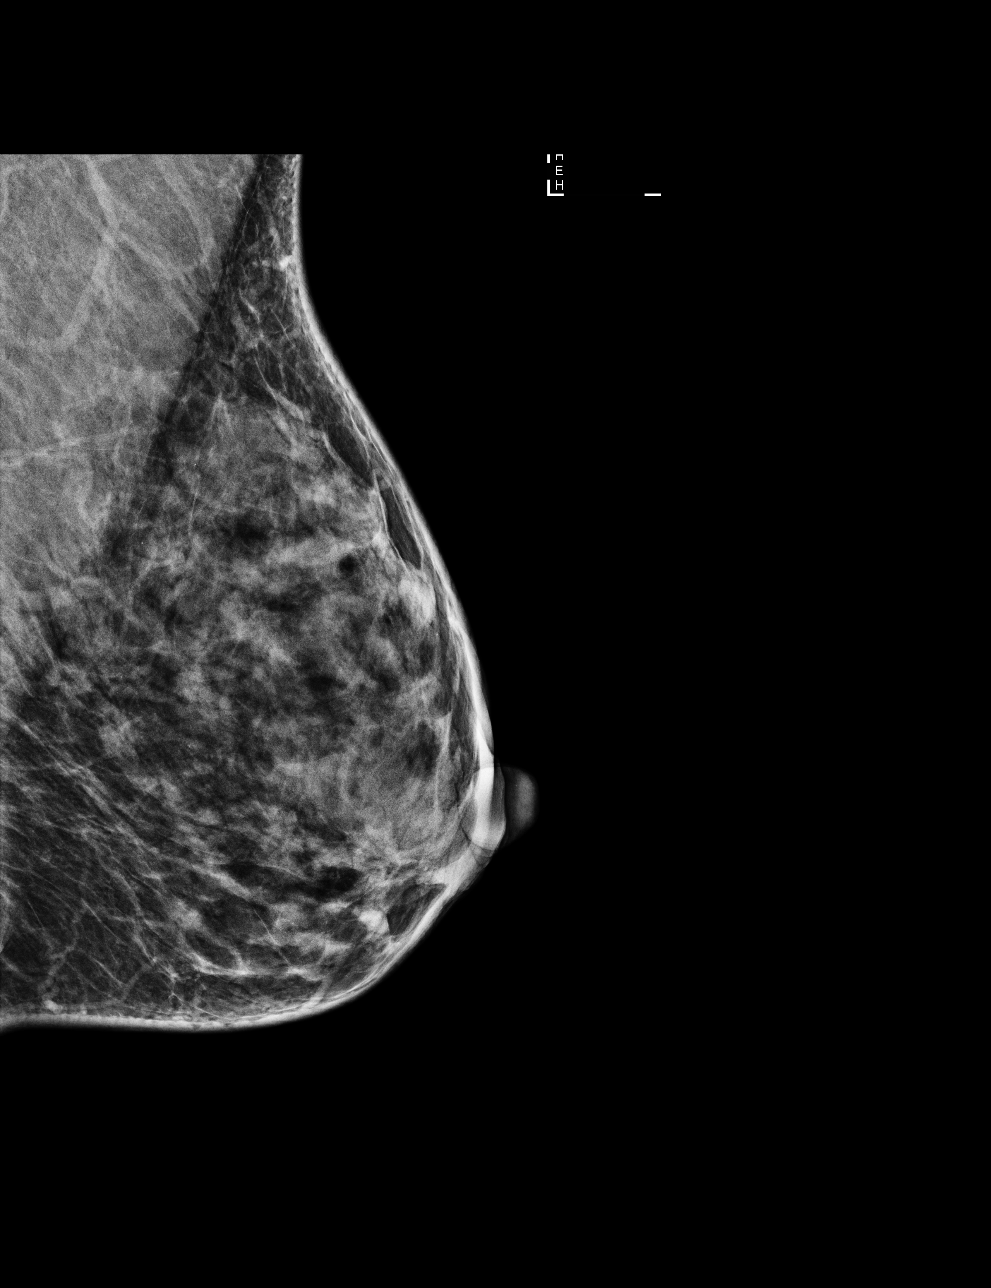

[R MLO]
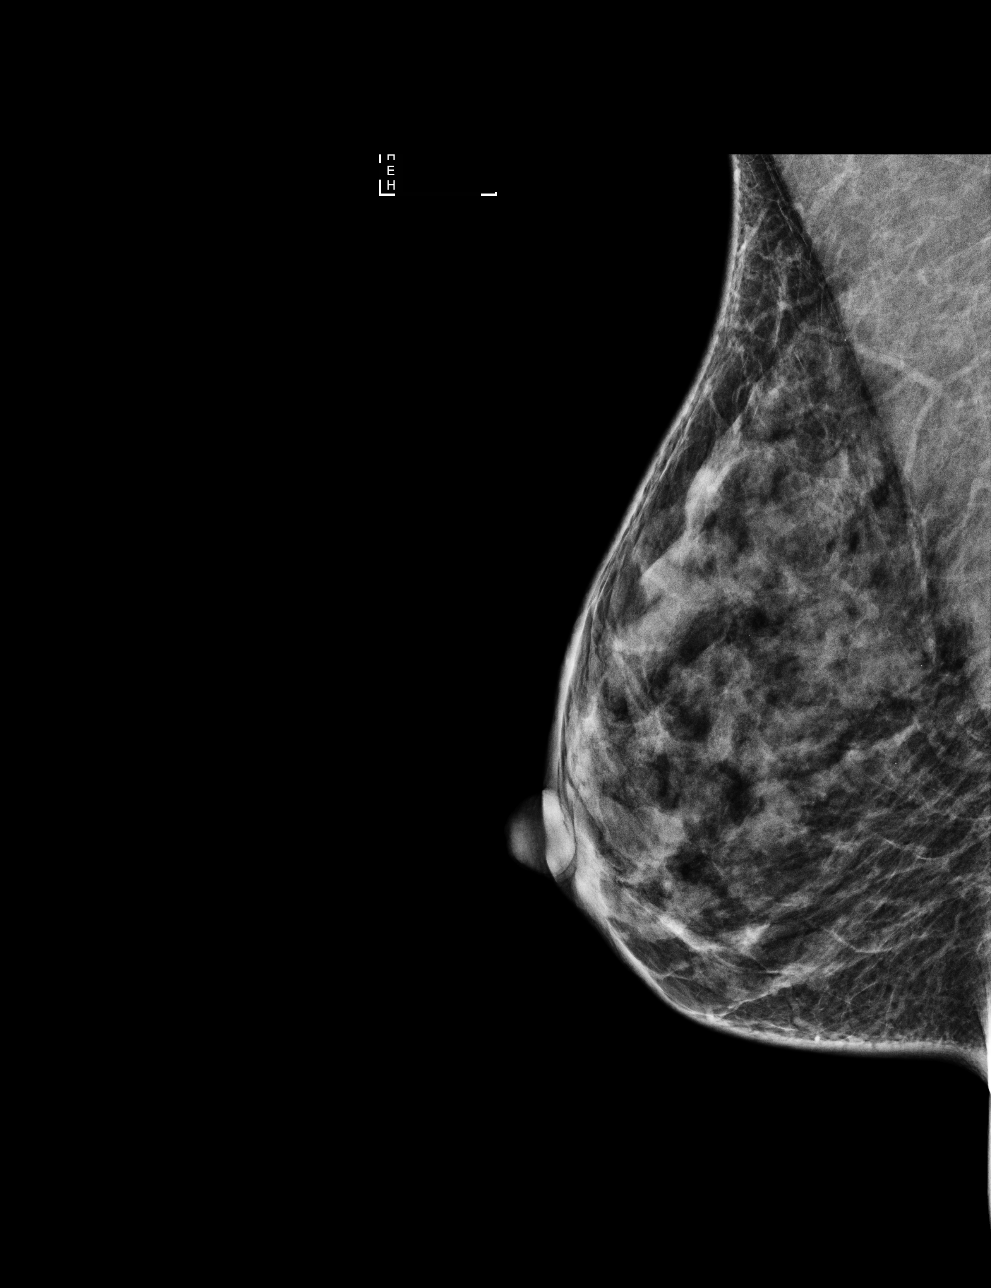

[L CC]
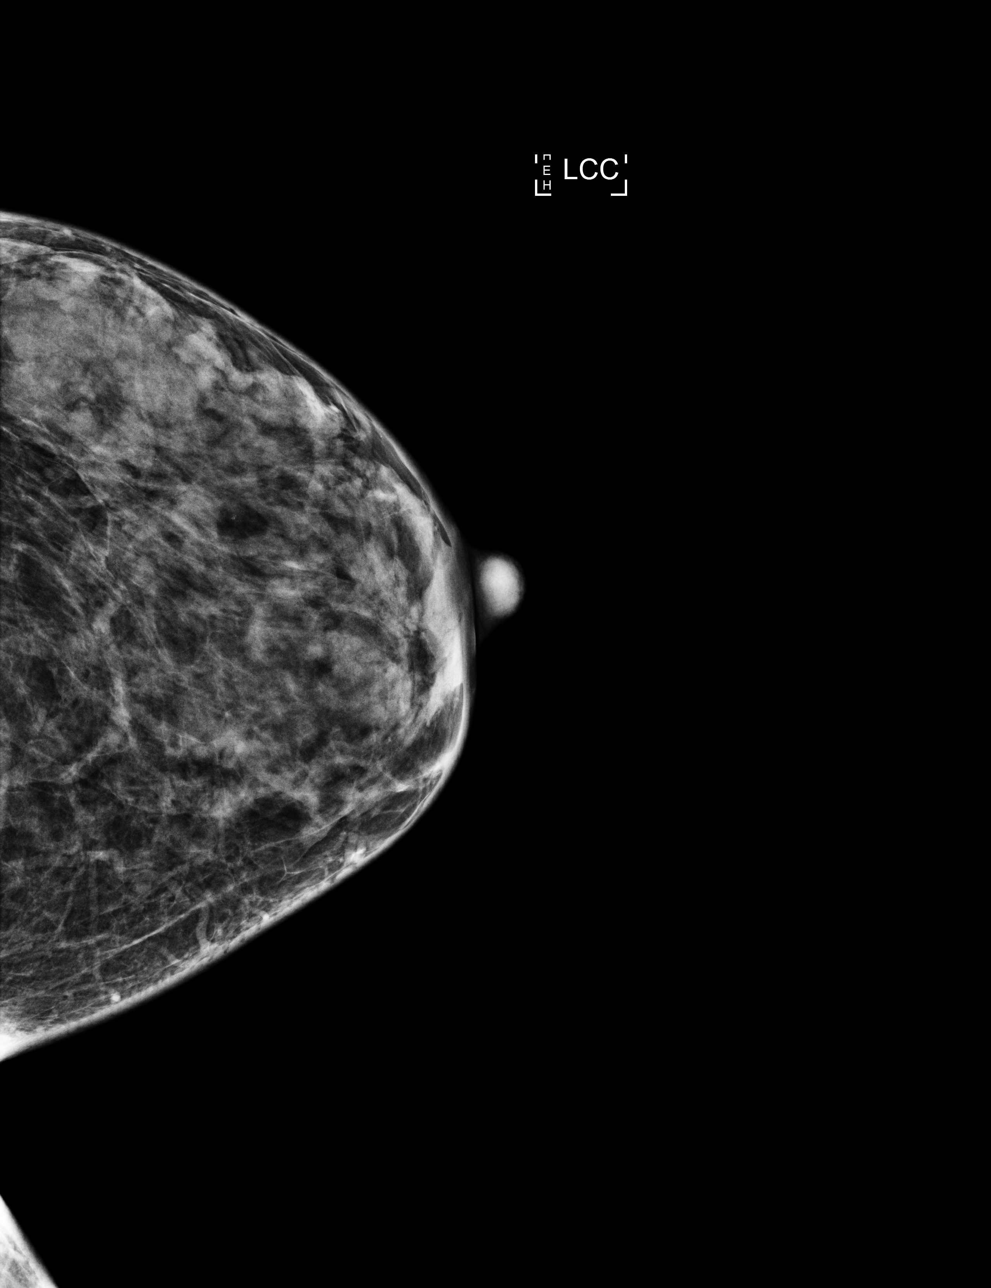

[4 of 4 positions shown; findings below may reference images not displayed]

ACR Breast Density Category d: The breast tissue is extremely dense,
which lowers the sensitivity of mammography.
FINDINGS: There are no findings suspicious for malignancy. Images were
processed with CAD.
IMPRESSION: No mammographic evidence of malignancy. A result letter of this
screening mammogram will be mailed directly to the patient.

RECOMMENDATION:
Screening mammogram in one year. (Code:8A-L-C49)

BI-RADS CATEGORY  1: Negative.

## 2022-01-19 ENCOUNTER — Other Ambulatory Visit (HOSPITAL_COMMUNITY): Payer: Self-pay | Admitting: Endocrinology

## 2022-01-19 DIAGNOSIS — E1369 Other specified diabetes mellitus with other specified complication: Secondary | ICD-10-CM

## 2022-02-03 ENCOUNTER — Ambulatory Visit (HOSPITAL_COMMUNITY)
Admission: RE | Admit: 2022-02-03 | Discharge: 2022-02-03 | Disposition: A | Discharge status: Home / Self Care | Payer: BC Managed Care – PPO | Source: Ambulatory Visit | Attending: Endocrinology | Admitting: Endocrinology

## 2022-02-03 DIAGNOSIS — E1369 Other specified diabetes mellitus with other specified complication: Secondary | ICD-10-CM | POA: Insufficient documentation

## 2022-03-30 ENCOUNTER — Other Ambulatory Visit (HOSPITAL_BASED_OUTPATIENT_CLINIC_OR_DEPARTMENT_OTHER): Payer: Self-pay | Admitting: Obstetrics & Gynecology

## 2022-03-30 DIAGNOSIS — Z1231 Encounter for screening mammogram for malignant neoplasm of breast: Secondary | ICD-10-CM

## 2022-03-31 ENCOUNTER — Ambulatory Visit (HOSPITAL_BASED_OUTPATIENT_CLINIC_OR_DEPARTMENT_OTHER)
Admission: RE | Admit: 2022-03-31 | Discharge: 2022-03-31 | Disposition: A | Discharge status: Home / Self Care | Payer: BC Managed Care – PPO | Source: Ambulatory Visit | Attending: Obstetrics & Gynecology | Admitting: Obstetrics & Gynecology

## 2022-03-31 DIAGNOSIS — Z1231 Encounter for screening mammogram for malignant neoplasm of breast: Secondary | ICD-10-CM | POA: Insufficient documentation

## 2022-06-04 ENCOUNTER — Ambulatory Visit (INDEPENDENT_AMBULATORY_CARE_PROVIDER_SITE_OTHER): Payer: BC Managed Care – PPO | Admitting: Obstetrics & Gynecology

## 2022-06-04 ENCOUNTER — Other Ambulatory Visit (HOSPITAL_COMMUNITY)
Admission: RE | Admit: 2022-06-04 | Discharge: 2022-06-04 | Disposition: A | Discharge status: Home / Self Care | Payer: BC Managed Care – PPO | Source: Ambulatory Visit | Attending: Obstetrics & Gynecology | Admitting: Obstetrics & Gynecology

## 2022-06-04 ENCOUNTER — Encounter: Payer: Self-pay | Admitting: Obstetrics & Gynecology

## 2022-06-04 VITALS — BP 120/68 | HR 66 | Ht 63.75 in | Wt 124.0 lb

## 2022-06-04 DIAGNOSIS — Z01419 Encounter for gynecological examination (general) (routine) without abnormal findings: Secondary | ICD-10-CM | POA: Diagnosis present

## 2022-06-04 DIAGNOSIS — Z9189 Other specified personal risk factors, not elsewhere classified: Secondary | ICD-10-CM

## 2022-06-04 NOTE — Progress Notes (Signed)
JAYLEN APPLER 1970-08-13 [id number]   History:    52 y.o. G2P2L2 Married.  Vasectomy.     RP:  Established patient presenting for annual gyn exam    HPI: Menses every 3 weeks, normal flow.  No BTB.  No pelvic pain.  No pain with IC. Pap Neg 04/2019.  Pap reflex today. Breasts normal.  Mammo Neg 03/2022.  Endocrinologist manages  hypothyroidism and diabetes.  Has always been an avid exerciser/runner, slim.  History of GDM. Normal Pap history.  BMI 21.45.  Health labs with Endocrino who manages Type II diabetic and is now insulin-dependent for past 3 years.  Hypothyroidism.  Will do Cologuard.   Past medical history,surgical history, family history and social history were all reviewed and documented in the EPIC chart.  Gynecologic History Patient's last menstrual period was 05/11/2022 (exact date).  Obstetric History OB History  Gravida Para Term Preterm AB Living  2 2 2    0 2  SAB IAB Ectopic Multiple Live Births          2    # Outcome Date GA Lbr Len/2nd Weight Sex Delivery Anes PTL Lv  2 Term     F Vag-Spont  N LIV  1 Term     F Vag-Spont  N LIV     ROS: A ROS was performed and pertinent positives and negatives are included in the history. GENERAL: No fevers or chills. HEENT: No change in vision, no earache, sore throat or sinus congestion. NECK: No pain or stiffness. CARDIOVASCULAR: No chest pain or pressure. No palpitations. PULMONARY: No shortness of breath, cough or wheeze. GASTROINTESTINAL: No abdominal pain, nausea, vomiting or diarrhea, melena or bright red blood per rectum. GENITOURINARY: No urinary frequency, urgency, hesitancy or dysuria. MUSCULOSKELETAL: No joint or muscle pain, no back pain, no recent trauma. DERMATOLOGIC: No rash, no itching, no lesions. ENDOCRINE: No polyuria, polydipsia, no heat or cold intolerance. No recent change in weight. HEMATOLOGICAL: No anemia or easy bruising or bleeding. NEUROLOGIC: No headache, seizures, numbness, tingling or weakness.  PSYCHIATRIC: No depression, no loss of interest in normal activity or change in sleep pattern.     Exam:   BP 120/68   Pulse 66   Ht 5' 3.75" (1.619 m)   Wt 124 lb (56.2 kg)   LMP 05/11/2022 (Exact Date) Comment: sexually active, husband vasectomy  SpO2 99%   BMI 21.45 kg/m   Body mass index is 21.45 kg/m.  General appearance : Well developed well nourished female. No acute distress HEENT: Eyes: no retinal hemorrhage or exudates,  Neck supple, trachea midline, no carotid bruits, no thyroidmegaly Lungs: Clear to auscultation, no rhonchi or wheezes, or rib retractions  Heart: Regular rate and rhythm, no murmurs or gallops Breast:Examined in sitting and supine position were symmetrical in appearance, no palpable masses or tenderness,  no skin retraction, no nipple inversion, no nipple discharge, no skin discoloration, no axillary or supraclavicular lymphadenopathy Abdomen: no palpable masses or tenderness, no rebound or guarding Extremities: no edema or skin discoloration or tenderness  Pelvic: Vulva: Normal             Vagina: No gross lesions or discharge  Cervix: No gross lesions or discharge. Pap reflex done.  Uterus  AV, normal size, shape and consistency, non-tender and mobile  Adnexa  Without masses or tenderness  Anus: Normal   Assessment/Plan:  52 y.o. female for annual exam   1. Encounter for routine gynecological examination with Papanicolaou smear of cervix Menses  every 3 weeks, normal flow.  No BTB.  No pelvic pain.  No pain with IC. Pap Neg 04/2019.  Pap reflex today. Breasts normal.  Mammo Neg 03/2022.  Endocrinologist manages  hypothyroidism and diabetes.  Has always been an avid exerciser/runner, slim.  History of GDM. Normal Pap history.  BMI 21.45.  Health labs with Endocrino who manages Type II diabetic and is now insulin-dependent for past 3 years.  Hypothyroidism.  Will do Cologuard. - Cologuard - Cytology - PAP( [place])  2. Relies on partner vasectomy  for contraception  Other orders - rosuvastatin (CRESTOR) 5 MG tablet; Take 5 mg by mouth daily. - levothyroxine (SYNTHROID) 75 MCG tablet; Take 75 mcg by mouth every morning. - SEMGLEE, YFGN, 100 UNIT/ML Pen; Inject into the skin. - Clindamycin-Benzoyl Per, Refr, gel; APPLY TO FACE IN THE MORNING - TRETINOIN EX; APPLY TO FACE AT BEDTIME AS NEEDED FOR ACNE   Genia Del MD, 2:45 PM

## 2022-06-11 LAB — CYTOLOGY - PAP
Comment: NEGATIVE
Diagnosis: UNDETERMINED — AB
High risk HPV: NEGATIVE

## 2022-06-21 LAB — COLOGUARD

## 2022-07-09 LAB — COLOGUARD: COLOGUARD: NEGATIVE

## 2022-11-09 ENCOUNTER — Ambulatory Visit (INDEPENDENT_AMBULATORY_CARE_PROVIDER_SITE_OTHER): Payer: BC Managed Care – PPO | Admitting: Nurse Practitioner

## 2022-11-09 ENCOUNTER — Encounter: Payer: Self-pay | Admitting: Nurse Practitioner

## 2022-11-09 VITALS — BP 112/62 | HR 71

## 2022-11-09 DIAGNOSIS — M545 Low back pain, unspecified: Secondary | ICD-10-CM | POA: Diagnosis not present

## 2022-11-09 LAB — URINALYSIS, COMPLETE W/RFL CULTURE
Bacteria, UA: NONE SEEN /HPF
Bilirubin Urine: NEGATIVE
Glucose, UA: NEGATIVE
Hgb urine dipstick: NEGATIVE
Hyaline Cast: NONE SEEN /LPF
Leukocyte Esterase: NEGATIVE
Nitrites, Initial: NEGATIVE
Protein, ur: NEGATIVE
RBC / HPF: NONE SEEN /HPF (ref 0–2)
Specific Gravity, Urine: 1.015 (ref 1.001–1.035)
WBC, UA: NONE SEEN /HPF (ref 0–5)
pH: 5.5 (ref 5.0–8.0)

## 2022-11-09 LAB — NO CULTURE INDICATED

## 2022-11-09 NOTE — Progress Notes (Signed)
Acute Office Visit  Subjective:    Patient ID: Eileen Gomez, female    DOB: 1970-04-05, 52 y.o.   MRN: [id number]   HPI 52 y.o. presents today for intermittent lower abdominal pain. Denies any urinary or vaginal symptoms. Had similar symptoms in the past and ended up with a kidney infection so she wants to rule this out. Symptoms have improved. Going out of town and wants to make sure she does not have infection.   Patient's last menstrual period was 11/03/2022 (exact date).    Review of Systems  Constitutional: Negative.   Gastrointestinal:  Positive for abdominal pain (Improved). Negative for constipation and diarrhea.  Genitourinary:  Negative for difficulty urinating, dysuria, flank pain, frequency, genital sores, hematuria, urgency, vaginal discharge and vaginal pain.       Objective:    Physical Exam Constitutional:      Appearance: Normal appearance.   GU: Not indicated  BP 112/62   Pulse 71   LMP 11/03/2022 (Exact Date)   SpO2 99%  Wt Readings from Last 3 Encounters:  06/04/22 124 lb (56.2 kg)  07/18/20 125 lb (56.7 kg)  05/17/19 125 lb (56.7 kg)        Patient informed chaperone available to be present for breast and/or pelvic exam. Patient has requested no chaperone to be present. Patient has been advised what will be completed during breast and pelvic exam.   UA negative  Assessment & Plan:   Problem List Items Addressed This Visit   None Visit Diagnoses     Low back pain without sciatica, unspecified back pain laterality, unspecified chronicity    -  Primary   Relevant Orders   Urinalysis,Complete w/RFL Culture      Plan: Negative UA. Symptoms better. Declines vaginal testing today. Will follow up if symptoms return.      Olivia Mackie DNP, 11:25 AM 11/09/2022

## 2023-04-05 ENCOUNTER — Ambulatory Visit (HOSPITAL_BASED_OUTPATIENT_CLINIC_OR_DEPARTMENT_OTHER)
Admission: RE | Admit: 2023-04-05 | Discharge: 2023-04-05 | Disposition: A | Discharge status: Home / Self Care | Payer: BC Managed Care – PPO | Source: Ambulatory Visit | Attending: Obstetrics and Gynecology | Admitting: Obstetrics and Gynecology

## 2023-04-05 ENCOUNTER — Encounter (HOSPITAL_BASED_OUTPATIENT_CLINIC_OR_DEPARTMENT_OTHER): Payer: Self-pay | Admitting: Radiology

## 2023-04-05 DIAGNOSIS — Z1231 Encounter for screening mammogram for malignant neoplasm of breast: Secondary | ICD-10-CM | POA: Diagnosis present

## 2023-04-08 ENCOUNTER — Other Ambulatory Visit: Payer: Self-pay | Admitting: Endocrinology

## 2023-04-08 DIAGNOSIS — R928 Other abnormal and inconclusive findings on diagnostic imaging of breast: Secondary | ICD-10-CM

## 2023-04-12 ENCOUNTER — Telehealth: Payer: Self-pay | Admitting: *Deleted

## 2023-04-12 NOTE — Telephone Encounter (Signed)
Spoke with patient, advised per Tiffany.  Patient appreciative of call. Will return call if any additional questions or assistance needed.   Encounter closed.

## 2023-04-12 NOTE — Telephone Encounter (Signed)
Call returned to patient. Screening MMG 04/05/23, recalled for left breast Dx MMG for left breast calcifications, scheduled 04/19/23.  Screening MMG ordered by Dr. Talmage Nap.   Patient completed genetics testing, returning for f/u to discuss results 04/21/23.   Patient asking if Dx MMG appropriate or can be added for right breast density. Reports family Hx of breast cancer.   Advised Dx MMG not typically ordered for dense breast. May further discuss risk for breast cancer with genetics and or GYN to determine if adding screening needed. Patient is requesting Tiffany to review and advise.   Last AEX 06/04/22. Declines to schedule at this time.

## 2023-04-12 NOTE — Telephone Encounter (Signed)
Agree diagnostic imaging not guidelines for dense breast tissue. If genetics determines she is high risk for breast cancer these guidelines could change. But agree with just following up for left breast imaging for calcifications.

## 2023-04-19 ENCOUNTER — Ambulatory Visit
Admission: RE | Admit: 2023-04-19 | Discharge: 2023-04-19 | Disposition: A | Discharge status: Home / Self Care | Payer: BC Managed Care – PPO | Source: Ambulatory Visit | Attending: Endocrinology | Admitting: Endocrinology

## 2023-04-19 ENCOUNTER — Other Ambulatory Visit: Payer: Self-pay | Admitting: Endocrinology

## 2023-04-19 DIAGNOSIS — R928 Other abnormal and inconclusive findings on diagnostic imaging of breast: Secondary | ICD-10-CM

## 2023-04-28 ENCOUNTER — Ambulatory Visit
Admission: RE | Admit: 2023-04-28 | Discharge: 2023-04-28 | Disposition: A | Discharge status: Home / Self Care | Payer: BC Managed Care – PPO | Source: Ambulatory Visit | Attending: Endocrinology | Admitting: Endocrinology

## 2023-04-28 ENCOUNTER — Ambulatory Visit
Admission: RE | Admit: 2023-04-28 | Discharge: 2023-04-28 | Disposition: A | Discharge status: Home / Self Care | Source: Ambulatory Visit | Attending: Endocrinology | Admitting: Endocrinology

## 2023-04-28 DIAGNOSIS — R928 Other abnormal and inconclusive findings on diagnostic imaging of breast: Secondary | ICD-10-CM

## 2023-04-28 HISTORY — PX: BREAST BIOPSY: SHX20

## 2023-04-29 LAB — SURGICAL PATHOLOGY

## 2023-07-22 ENCOUNTER — Other Ambulatory Visit: Payer: Self-pay | Admitting: Obstetrics and Gynecology

## 2023-07-22 DIAGNOSIS — Z9189 Other specified personal risk factors, not elsewhere classified: Secondary | ICD-10-CM

## 2023-10-29 ENCOUNTER — Other Ambulatory Visit

## 2024-04-28 ENCOUNTER — Encounter (HOSPITAL_BASED_OUTPATIENT_CLINIC_OR_DEPARTMENT_OTHER): Admitting: Radiology

## 2024-04-28 DIAGNOSIS — Z1231 Encounter for screening mammogram for malignant neoplasm of breast: Secondary | ICD-10-CM

## 2024-05-01 ENCOUNTER — Encounter (HOSPITAL_BASED_OUTPATIENT_CLINIC_OR_DEPARTMENT_OTHER): Admitting: Radiology
